# Patient Record
Sex: Female | Born: 2000 | Race: Black or African American | Hispanic: No | Marital: Single | Smoking: Never smoker
Health system: Southern US, Community
[De-identification: ages and names within clinical notes are randomized; demographics above are authoritative.]

---

## 2018-08-27 ENCOUNTER — Ambulatory Visit (INDEPENDENT_AMBULATORY_CARE_PROVIDER_SITE_OTHER): Payer: Managed Care, Other (non HMO)

## 2018-08-27 ENCOUNTER — Ambulatory Visit (HOSPITAL_COMMUNITY)
Admission: EM | Admit: 2018-08-27 | Discharge: 2018-08-27 | Disposition: A | Discharge status: Home / Self Care | Payer: Managed Care, Other (non HMO) | Attending: Family Medicine | Admitting: Family Medicine

## 2018-08-27 ENCOUNTER — Encounter (HOSPITAL_COMMUNITY): Payer: Self-pay | Admitting: Emergency Medicine

## 2018-08-27 ENCOUNTER — Other Ambulatory Visit: Payer: Self-pay

## 2018-08-27 DIAGNOSIS — S8991XA Unspecified injury of right lower leg, initial encounter: Secondary | ICD-10-CM | POA: Diagnosis not present

## 2018-08-27 MED ORDER — IBUPROFEN 600 MG PO TABS: 600.0000 mg | ORAL_TABLET | Freq: Four times a day (QID) | ORAL | 0 refills | Status: AC | PRN

## 2018-08-27 MED ORDER — CRUTCHES-ALUMINUM MISC: 1.0000 "application " | Freq: Once | 0 refills | Status: AC

## 2018-08-27 NOTE — Discharge Instructions (Addendum)
X-ray normal Please wear Ace wrap to provide further compression to help with swelling and pain Use anti-inflammatories for pain/swelling. You may take up to 800 mg Ibuprofen every 8 hours with food. You may supplement Ibuprofen with Tylenol [phone] mg every 8 hours.   Please follow-up with orthopedic if pain worsening, symptoms not improving in 1 to 2 weeks

## 2018-08-27 NOTE — ED Provider Notes (Addendum)
[facility]    CSN: [id number] Arrival date & time: 08/27/18  1836     History   Chief Complaint Chief Complaint  Patient presents with  . Knee Injury    right    HPI Holly Wade is a 17 y.o. female no significant past medical history presenting today for evaluation of right knee injury.  Patient was climbing onto her bed in a dorm room standing on a dresser, she her knee twisted and she fell off the dresser onto the floor.  She landed on her back.  She did not have immediate pain, but hours later her pain sudden and developed some swelling.  Since she has been limping and had pain with weightbearing.  She has minimal pain at rest.  Notices most of her pain to her medial aspect of her knee.  She denies feeling a popping or tearing sensation.  She does feel as if she can feel something moving in her knee.  HPI  History reviewed. No pertinent past medical history.  There are no active problems to display for this patient.   History reviewed. No pertinent surgical history.  OB History   None      Home Medications    Prior to Admission medications   Medication Sig Start Date End Date Taking? Authorizing Provider  ibuprofen (ADVIL,MOTRIN) 600 MG tablet Take 1 tablet (600 mg total) by mouth every 6 (six) hours as needed. 08/27/18   Crescentia Boutwell, Junius Creamer, PA-C    Family History History reviewed. No pertinent family history.  Social History Social History   Tobacco Use  . Smoking status: Never Smoker  . Smokeless tobacco: Never Used  Substance Use Topics  . Alcohol use: Not Currently  . Drug use: Never     Allergies   Patient has no known allergies.   Review of Systems Review of Systems  Constitutional: Negative for fatigue and fever.  Eyes: Negative for visual disturbance.  Respiratory: Negative for shortness of breath.   Cardiovascular: Negative for chest pain.  Gastrointestinal: Negative for abdominal pain, nausea and vomiting.  Musculoskeletal:  Positive for arthralgias, gait problem, joint swelling and myalgias. Negative for back pain, neck pain and neck stiffness.  Skin: Negative for color change, rash and wound.  Neurological: Negative for dizziness, weakness, light-headedness and headaches.     Physical Exam Triage Vital Signs ED Triage Vitals  Enc Vitals Group     BP 08/27/18 1857 124/83     Pulse Rate 08/27/18 1857 74     Resp --      Temp 08/27/18 1857 98.6 F (37 C)     Temp Source 08/27/18 1857 Oral     SpO2 08/27/18 1857 98 %     Weight --      Height --      Head Circumference --      Peak Flow --      Pain Score 08/27/18 1852 5     Pain Loc --      Pain Edu? --      Excl. in GC? --    No data found.  Updated Vital Signs BP 124/83 (BP Location: Left Arm)   Pulse 74   Temp 98.6 F (37 C) (Oral)   LMP 08/19/2018 (Exact Date)   SpO2 98%   Visual Acuity Right Eye Distance:   Left Eye Distance:   Bilateral Distance:    Right Eye Near:   Left Eye Near:    Bilateral Near:  Physical Exam  Constitutional: She is oriented to person, place, and time. She appears well-developed and well-nourished.  No acute distress  HENT:  Head: Normocephalic and atraumatic.  Nose: Nose normal.  Eyes: Conjunctivae are normal.  Neck: Neck supple.  Cardiovascular: Normal rate.  Pulmonary/Chest: Effort normal. No respiratory distress.  Abdominal: She exhibits no distension.  Musculoskeletal: Normal range of motion.  Mild swelling to right knee, no overlying erythema or abrasion, nontender to manipulation of patella, tenderness to palpation of medial joint line, nontender over lateral joint line, nontender over tibial tubercle  Patient has full active extension of knee, limited flexion as she feels something moving with flexion  No varus or valgus stress appreciated, negative Lachman's, anterior posterior drawer unable to be attempted due to limitation of flexion, negative McMurray's.  Neurological: She is alert  and oriented to person, place, and time.  Skin: Skin is warm and dry.  Psychiatric: She has a normal mood and affect.  Nursing note and vitals reviewed.    UC Treatments / Results  Labs (all labs ordered are listed, but only abnormal results are displayed) Labs Reviewed - No data to display  EKG None  Radiology Dg Knee Complete 4 Views Right  Result Date: 08/27/2018 CLINICAL DATA:  Right knee pain and limited range of motion since a twisting injury getting on a bed yesterday. Initial encounter. EXAM: RIGHT KNEE - COMPLETE 4+ VIEW COMPARISON:  None. FINDINGS: No acute bony abnormality is identified. Joint spaces are preserved. Small to moderate joint effusion noted. IMPRESSION: Small to moderate joint effusion.  Otherwise negative. Electronically Signed   By: Drusilla Kanner M.D.   On: 08/27/2018 19:29    Procedures Procedures (including critical care time)  Medications Ordered in UC Medications - No data to display  Initial Impression / Assessment and Plan / UC Course  I have reviewed the triage vital signs and the nursing notes.  Pertinent labs & imaging results that were available during my care of the patient were reviewed by me and considered in my medical decision making (see chart for details).     X-ray negative, small to mild joint effusion.  Likely sprain versus meniscal tear.  Will provide patient with Ace wrap and crutches, follow-up with orthopedics for further evaluation.  Tylenol and ibuprofen, ice for pain and swelling.Discussed strict return precautions. Patient verbalized understanding and is agreeable with plan.  Final Clinical Impressions(s) / UC Diagnoses   Final diagnoses:  Injury of right knee, initial encounter     Discharge Instructions     X-ray normal Please wear Ace wrap to provide further compression to help with swelling and pain Use anti-inflammatories for pain/swelling. You may take up to 800 mg Ibuprofen every 8 hours with food. You may  supplement Ibuprofen with Tylenol [phone] mg every 8 hours.   Please follow-up with orthopedic if pain worsening, symptoms not improving in 1 to 2 weeks    ED Prescriptions    Medication Sig Dispense Auth. Provider   ibuprofen (ADVIL,MOTRIN) 600 MG tablet Take 1 tablet (600 mg total) by mouth every 6 (six) hours as needed. 30 tablet Kendell Sagraves, [place] C, PA-C     Controlled Substance Prescriptions [place] Controlled Substance Registry consulted? Not Applicable   Lew Dawes, PA-C 08/27/18 1934    Lew Dawes, PA-C 08/27/18 2006

## 2018-08-27 NOTE — ED Triage Notes (Signed)
Pt states she was trying to get on her bed when she twisted her right knee and fell. Pt complains of pain to the right medial anterior portion of her knee.

## 2020-01-12 IMAGING — DX DG KNEE COMPLETE 4+V*R*
4 series · 4 of 4 positions shown · non-contrast
Comparison: None.

CLINICAL DATA: Right knee pain and limited range of motion since a
twisting injury getting on a bed yesterday. Initial encounter.

EXAM:
RIGHT KNEE - COMPLETE 4+ VIEW

[knee ap]
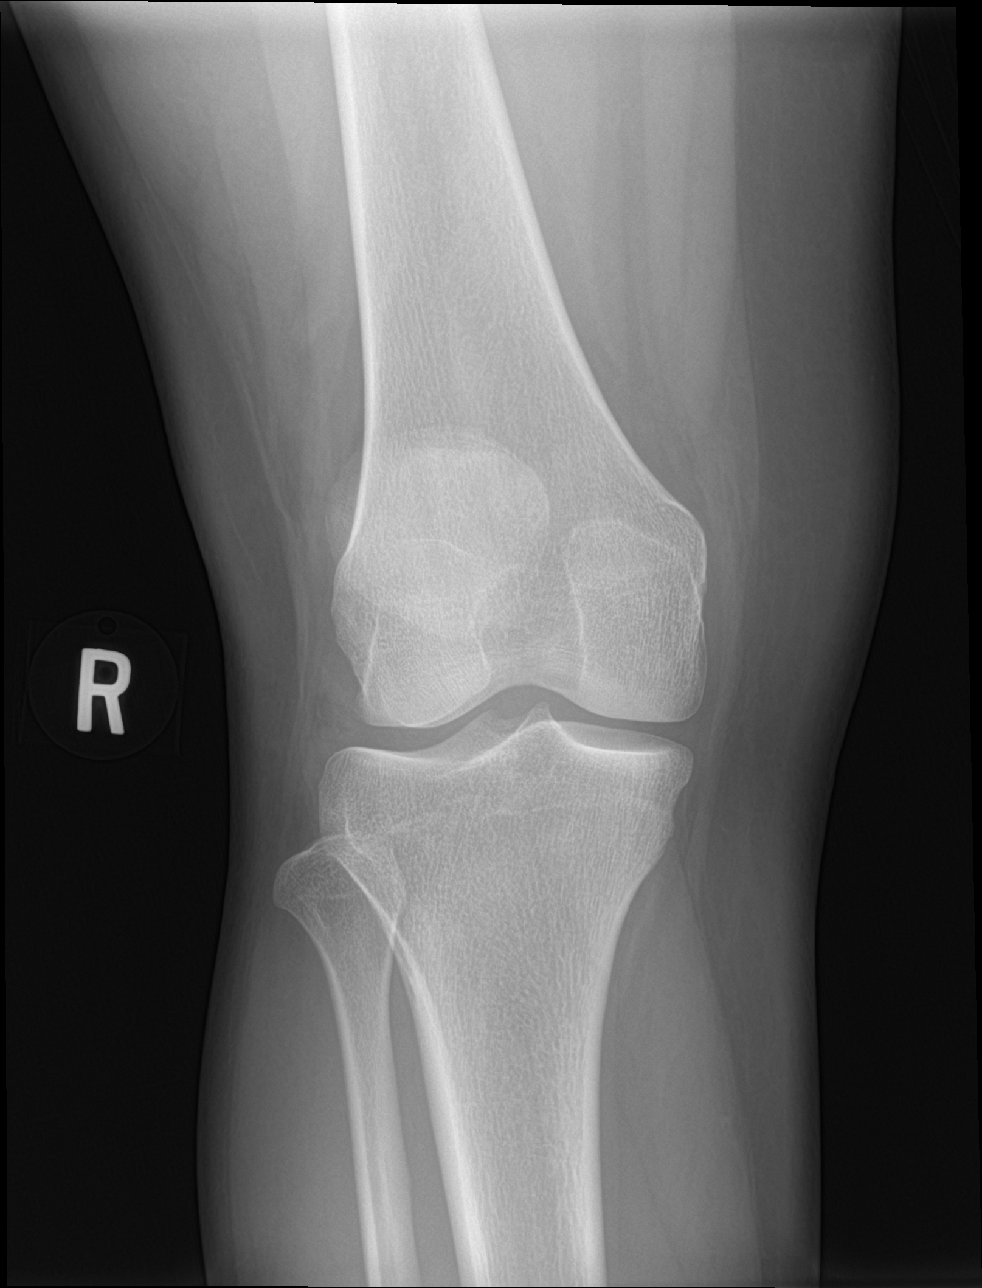

[knee obl (1 of 2)]
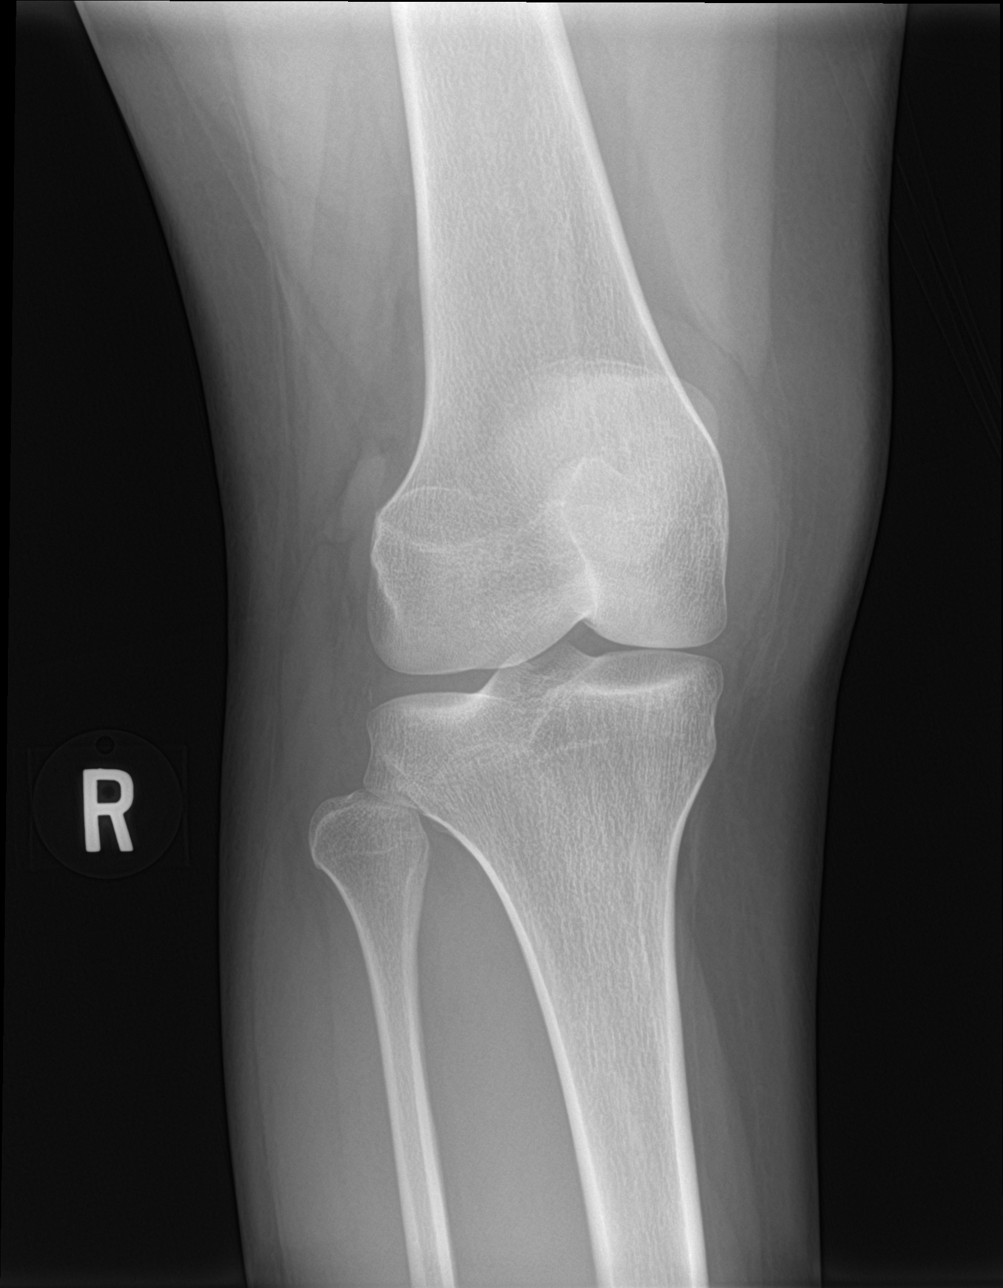

[knee obl (2 of 2)]
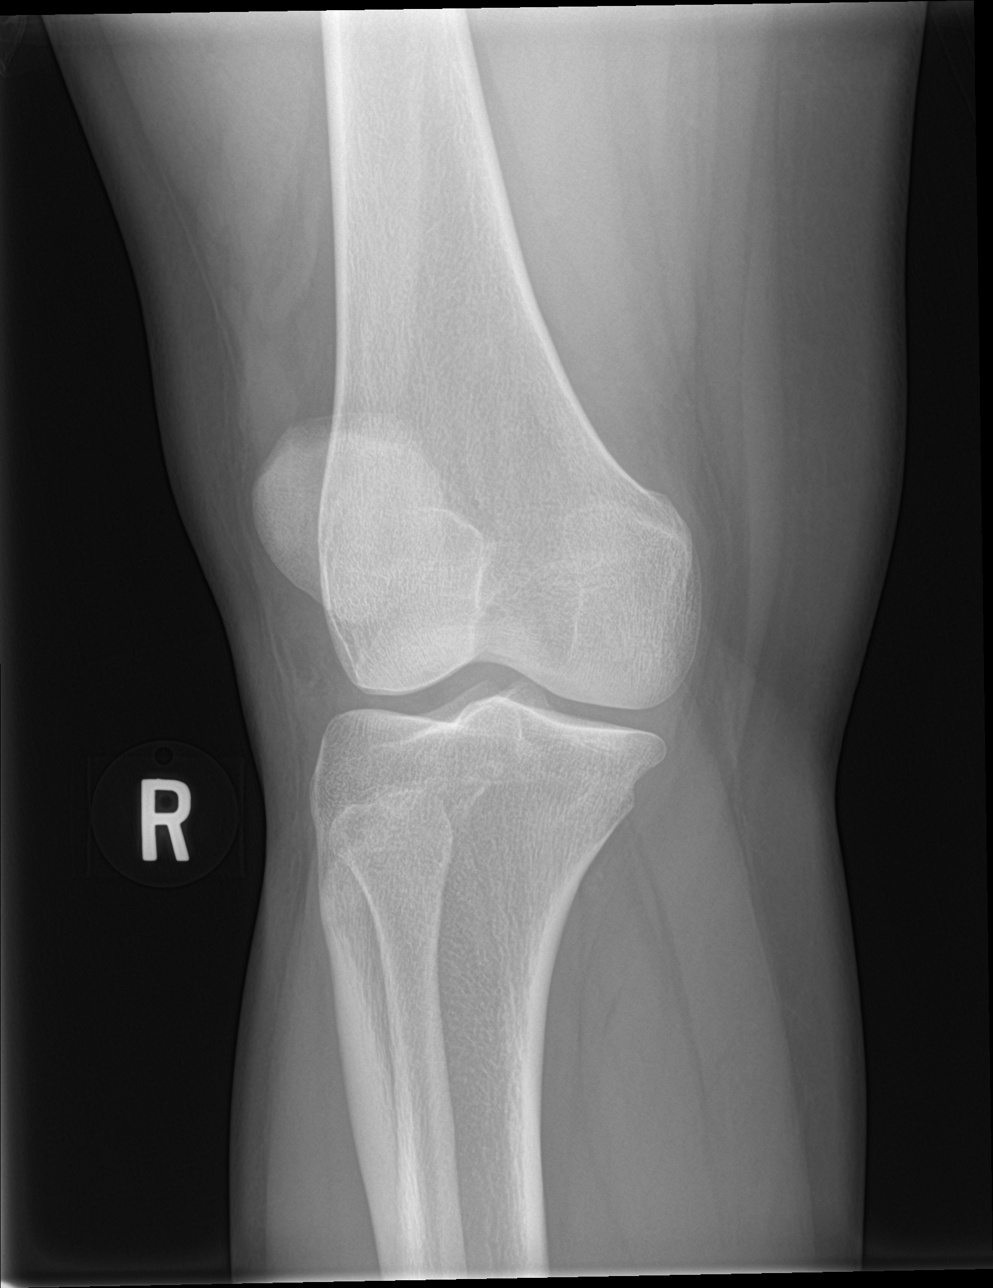

[knee lat]
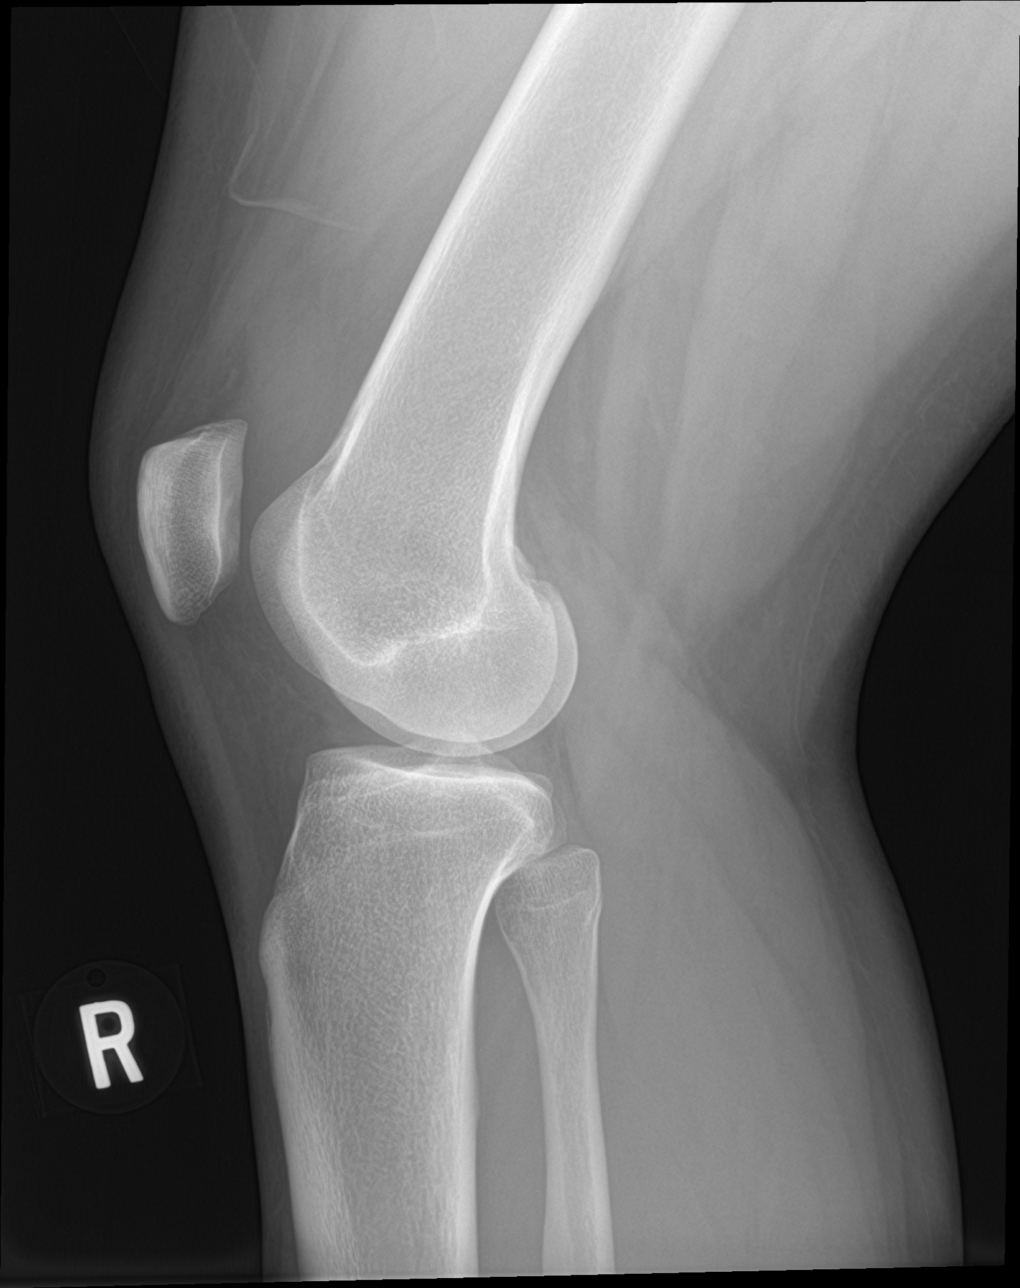

[4 of 4 positions shown; findings below may reference images not displayed]

FINDINGS: No acute bony abnormality is identified. Joint spaces are preserved.
Small to moderate joint effusion noted.
IMPRESSION: Small to moderate joint effusion.  Otherwise negative.
